# Patient Record
Sex: Male | Born: 1995 | Race: White | Hispanic: No | Marital: Single | Smoking: Current every day smoker
Health system: Southern US, Community
[De-identification: ages and names within clinical notes are randomized; demographics above are authoritative.]

---
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
## 2016-10-26 ENCOUNTER — Other Ambulatory Visit: Payer: Self-pay | Admitting: Orthopedic Surgery

## 2016-10-26 DIAGNOSIS — IMO0001 Reserved for inherently not codable concepts without codable children: Secondary | ICD-10-CM

## 2016-11-04 ENCOUNTER — Inpatient Hospital Stay: Admission: RE | Admit: 2016-11-04 | Payer: 59 | Source: Ambulatory Visit

## 2016-11-04 ENCOUNTER — Other Ambulatory Visit: Payer: 59

## 2016-11-04 ENCOUNTER — Ambulatory Visit
Admission: RE | Admit: 2016-11-04 | Discharge: 2016-11-04 | Disposition: A | Discharge status: Home / Self Care | Payer: 59 | Source: Ambulatory Visit | Attending: Orthopedic Surgery | Admitting: Orthopedic Surgery

## 2016-11-04 DIAGNOSIS — IMO0001 Reserved for inherently not codable concepts without codable children: Secondary | ICD-10-CM

## 2017-05-12 IMAGING — CT CT SHOULDER*R* W/O CM
3 of 4 series · 7 of 14 positions shown, 8 images · non-contrast
Comparison: None.

CLINICAL DATA: History of recurrent right shoulder dislocations
over the past 1-1/2 years with pain. Symptoms have worsened over the
past 2 months.

EXAM:
CT OF THE RIGHT SHOULDER WITHOUT CONTRAST
TECHNIQUE: Multidetector CT imaging was performed according to the standard
protocol. Multiplanar CT image reconstructions were also generated.

[Series 5: shoulder soft · axial · 0.47mm/px · z∈[-212,-19]mm · 3 of 78 slices shown, 4 images]
[im 1/78  soft-tissue]
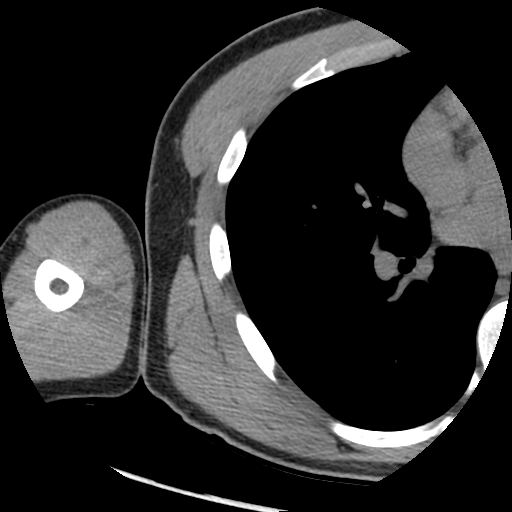
[im 1/78  bone]
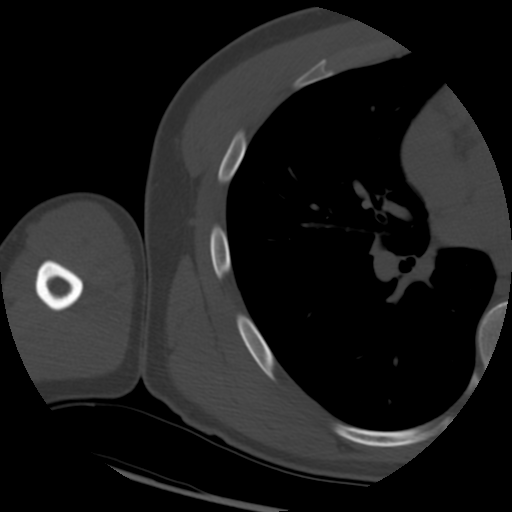
[im 39/78  bone]
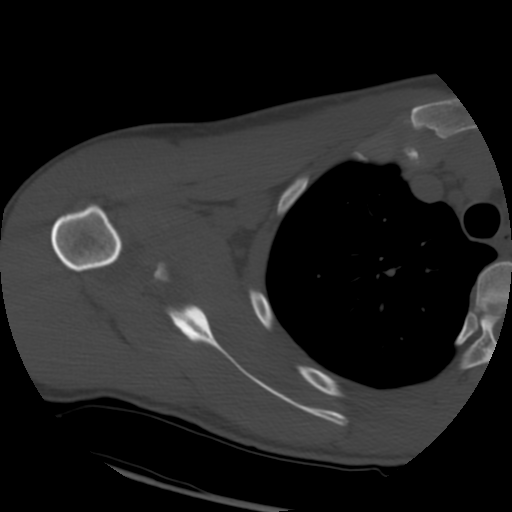
[im 78/78  bone]
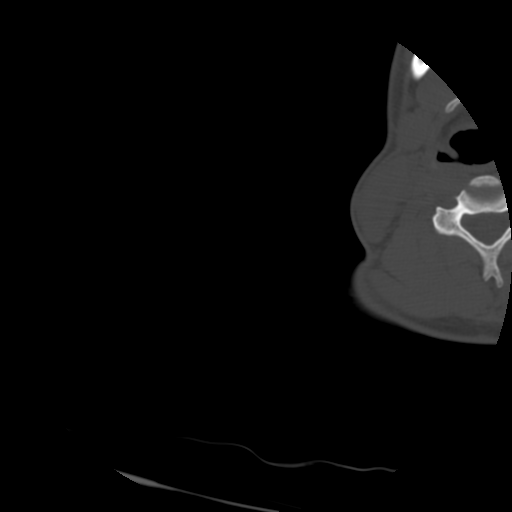

[Series 300: cor soft · sagittal · 0.47mm/px · 2 of 117 slices shown]
[im 39/117  soft-tissue]
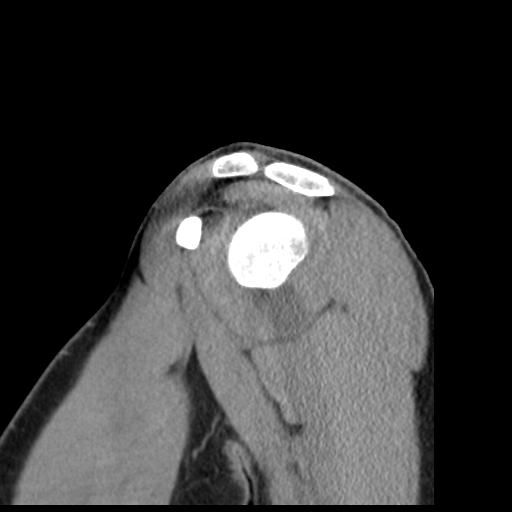
[im 78/117  soft-tissue]
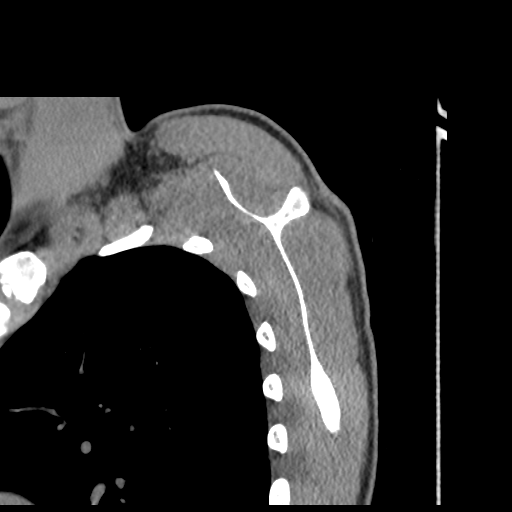

[Series 302: sag soft · sagittal · 0.47mm/px · 2 of 106 slices shown]
[im 36/106  soft-tissue]
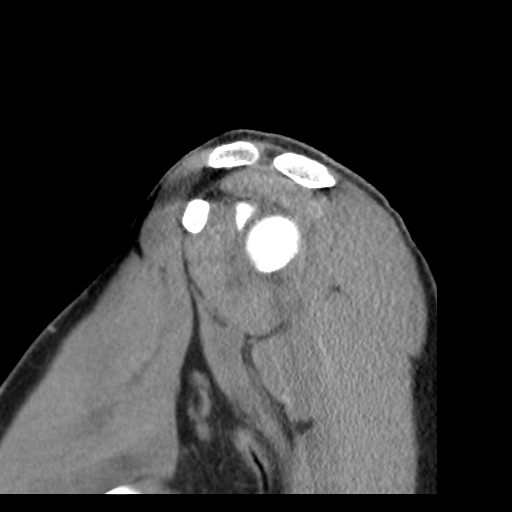
[im 71/106  soft-tissue]
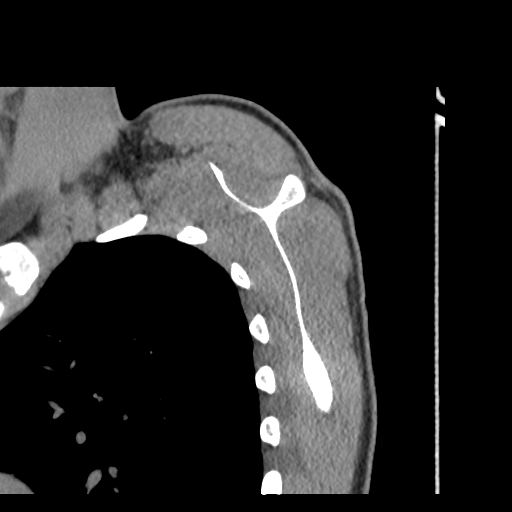

[7 of 14 positions shown; findings below may reference images not displayed]

FINDINGS: Hill-Sachs lesion is identified. Fragment of the anterior, inferior
glenoid measures 0.5 cm AP x 1.9 cm craniocaudal at the articular
surface. There is stripping of cortical bone off the anterior,
inferior glenoid with a fragment measuring 1.6 cm transverse
identified. Gap in bone at the articular surface of the glenoid
measures approximately 0.5 cm AP by 0.6 cm craniocaudal. The lesion
appears chronic with some cortication of fracture margins. The
humeral head is located but shows mild anterior subluxation on the
exam.

The rotator cuff appears intact. No muscular atrophy about the
shoulder is identified. No obvious ligament tear is seen. The
acromioclavicular joint is unremarkable. Imaged ribs appear normal.
Lung parenchyma is clear.
IMPRESSION: Findings consistent with recurrent anterior right shoulder
dislocations with a prominent Hill-Sachs lesion and large bony
Bankart as described above.
